# Patient Record
Sex: Female | Born: 1945 | Race: Black or African American | Hispanic: No | Smoking: Never smoker
Health system: Southern US, Community
[De-identification: ages and names within clinical notes are randomized; demographics above are authoritative.]

## PROBLEM LIST (undated history)

## (undated) DIAGNOSIS — C50919 Malignant neoplasm of unspecified site of unspecified female breast: Secondary | ICD-10-CM

## (undated) HISTORY — PX: TUBAL LIGATION: SHX77

## (undated) HISTORY — PX: MASTECTOMY: SHX3

---

## 1998-04-03 ENCOUNTER — Emergency Department (HOSPITAL_COMMUNITY): Admission: EM | Admit: 1998-04-03 | Discharge: 1998-04-03 | Payer: Self-pay | Admitting: Emergency Medicine

## 1998-12-13 ENCOUNTER — Ambulatory Visit (HOSPITAL_COMMUNITY): Admission: RE | Admit: 1998-12-13 | Discharge: 1998-12-13 | Payer: Self-pay | Admitting: Oncology

## 1998-12-13 ENCOUNTER — Encounter: Payer: Self-pay | Admitting: Oncology

## 1999-06-13 ENCOUNTER — Encounter: Payer: Self-pay | Admitting: Sports Medicine

## 1999-06-13 ENCOUNTER — Ambulatory Visit (HOSPITAL_COMMUNITY): Admission: RE | Admit: 1999-06-13 | Discharge: 1999-06-13 | Payer: Self-pay | Admitting: Sports Medicine

## 1999-06-27 ENCOUNTER — Ambulatory Visit (HOSPITAL_COMMUNITY): Admission: RE | Admit: 1999-06-27 | Discharge: 1999-06-27 | Payer: Self-pay | Admitting: Sports Medicine

## 1999-06-27 ENCOUNTER — Encounter: Payer: Self-pay | Admitting: Sports Medicine

## 1999-08-22 ENCOUNTER — Encounter: Payer: Self-pay | Admitting: Sports Medicine

## 1999-08-22 ENCOUNTER — Ambulatory Visit (HOSPITAL_COMMUNITY): Admission: RE | Admit: 1999-08-22 | Discharge: 1999-08-22 | Payer: Self-pay | Admitting: Sports Medicine

## 2000-09-22 ENCOUNTER — Encounter: Admission: RE | Admit: 2000-09-22 | Discharge: 2000-09-22 | Payer: Self-pay | Admitting: Oncology

## 2000-09-22 ENCOUNTER — Encounter: Payer: Self-pay | Admitting: Oncology

## 2000-11-01 ENCOUNTER — Encounter: Payer: Self-pay | Admitting: Neurosurgery

## 2000-11-01 ENCOUNTER — Ambulatory Visit (HOSPITAL_COMMUNITY): Admission: RE | Admit: 2000-11-01 | Discharge: 2000-11-01 | Payer: Self-pay | Admitting: Neurosurgery

## 2000-11-01 ENCOUNTER — Encounter: Payer: Self-pay | Admitting: Sports Medicine

## 2001-07-16 ENCOUNTER — Emergency Department (HOSPITAL_COMMUNITY): Admission: EM | Admit: 2001-07-16 | Discharge: 2001-07-16 | Payer: Self-pay

## 2001-09-01 ENCOUNTER — Other Ambulatory Visit: Admission: RE | Admit: 2001-09-01 | Discharge: 2001-09-01 | Payer: Self-pay | Admitting: Internal Medicine

## 2001-09-27 ENCOUNTER — Encounter: Admission: RE | Admit: 2001-09-27 | Discharge: 2001-09-27 | Payer: Self-pay | Admitting: Internal Medicine

## 2001-09-27 ENCOUNTER — Encounter: Payer: Self-pay | Admitting: Internal Medicine

## 2001-10-27 ENCOUNTER — Emergency Department (HOSPITAL_COMMUNITY): Admission: EM | Admit: 2001-10-27 | Discharge: 2001-10-27 | Payer: Self-pay

## 2002-02-22 ENCOUNTER — Encounter: Payer: Self-pay | Admitting: Sports Medicine

## 2002-02-22 ENCOUNTER — Encounter: Admission: RE | Admit: 2002-02-22 | Discharge: 2002-02-22 | Payer: Self-pay | Admitting: Sports Medicine

## 2002-03-08 ENCOUNTER — Encounter: Payer: Self-pay | Admitting: Sports Medicine

## 2002-03-08 ENCOUNTER — Encounter: Admission: RE | Admit: 2002-03-08 | Discharge: 2002-03-08 | Payer: Self-pay | Admitting: Sports Medicine

## 2002-03-22 ENCOUNTER — Encounter: Admission: RE | Admit: 2002-03-22 | Discharge: 2002-03-22 | Payer: Self-pay | Admitting: Sports Medicine

## 2002-03-22 ENCOUNTER — Encounter: Payer: Self-pay | Admitting: Sports Medicine

## 2002-03-29 ENCOUNTER — Encounter: Admission: RE | Admit: 2002-03-29 | Discharge: 2002-06-19 | Payer: Self-pay | Admitting: Sports Medicine

## 2003-08-31 ENCOUNTER — Encounter: Admission: RE | Admit: 2003-08-31 | Discharge: 2003-08-31 | Payer: Self-pay | Admitting: Internal Medicine

## 2004-11-13 ENCOUNTER — Ambulatory Visit (HOSPITAL_COMMUNITY): Admission: RE | Admit: 2004-11-13 | Discharge: 2004-11-13 | Payer: Self-pay | Admitting: Internal Medicine

## 2005-04-12 ENCOUNTER — Emergency Department (HOSPITAL_COMMUNITY): Admission: EM | Admit: 2005-04-12 | Discharge: 2005-04-12 | Payer: Self-pay | Admitting: Emergency Medicine

## 2005-06-05 ENCOUNTER — Ambulatory Visit (HOSPITAL_COMMUNITY): Admission: RE | Admit: 2005-06-05 | Discharge: 2005-06-05 | Payer: Self-pay | Admitting: Family Medicine

## 2006-08-26 ENCOUNTER — Ambulatory Visit (HOSPITAL_COMMUNITY): Admission: RE | Admit: 2006-08-26 | Discharge: 2006-08-26 | Payer: Self-pay | Admitting: Internal Medicine

## 2006-09-20 ENCOUNTER — Encounter: Admission: RE | Admit: 2006-09-20 | Discharge: 2006-09-20 | Payer: Self-pay | Admitting: Sports Medicine

## 2006-10-08 ENCOUNTER — Encounter: Admission: RE | Admit: 2006-10-08 | Discharge: 2006-10-08 | Payer: Self-pay | Admitting: Sports Medicine

## 2006-10-29 ENCOUNTER — Encounter: Admission: RE | Admit: 2006-10-29 | Discharge: 2006-10-29 | Payer: Self-pay | Admitting: General Surgery

## 2007-01-14 ENCOUNTER — Emergency Department (HOSPITAL_COMMUNITY): Admission: EM | Admit: 2007-01-14 | Discharge: 2007-01-14 | Payer: Self-pay | Admitting: Emergency Medicine

## 2007-08-31 ENCOUNTER — Ambulatory Visit (HOSPITAL_COMMUNITY): Admission: RE | Admit: 2007-08-31 | Discharge: 2007-08-31 | Payer: Self-pay | Admitting: Internal Medicine

## 2008-11-09 ENCOUNTER — Ambulatory Visit (HOSPITAL_COMMUNITY): Admission: RE | Admit: 2008-11-09 | Discharge: 2008-11-09 | Payer: Self-pay | Admitting: Internal Medicine

## 2010-07-16 ENCOUNTER — Ambulatory Visit (HOSPITAL_COMMUNITY): Admission: RE | Admit: 2010-07-16 | Discharge: 2010-07-16 | Payer: Self-pay | Admitting: Internal Medicine

## 2010-10-26 ENCOUNTER — Emergency Department (HOSPITAL_COMMUNITY)
Admission: EM | Admit: 2010-10-26 | Discharge: 2010-10-26 | Payer: Self-pay | Source: Home / Self Care | Admitting: Emergency Medicine

## 2010-10-31 LAB — BASIC METABOLIC PANEL
BUN: 12 mg/dL (ref 6–23)
CO2: 28 mEq/L (ref 19–32)
Calcium: 10.5 mg/dL (ref 8.4–10.5)
Chloride: 104 mEq/L (ref 96–112)
Creatinine, Ser: 0.89 mg/dL (ref 0.4–1.2)
GFR calc Af Amer: >60 mL/min (ref >60)
GFR calc non Af Amer: >60 mL/min (ref >60)
Glucose, Bld: 90 mg/dL (ref 70–99)
Potassium: 3.9 mEq/L (ref 3.5–5.1)
Sodium: 139 mEq/L (ref 135–145)

## 2010-10-31 LAB — CBC
HCT: 37.4 % (ref 36.0–46.0)
Hemoglobin: 12.8 g/dL (ref 12.0–15.0)
MCH: 31.4 pg (ref 26.0–34.0)
MCHC: 34.2 g/dL (ref 30.0–36.0)
MCV: 91.9 fL (ref 78.0–100.0)
Platelets: 292 10*3/uL (ref 150–400)
RBC: 4.07 MIL/uL (ref 3.87–5.11)
RDW: 12.9 % (ref 11.5–15.5)
WBC: 9.6 10*3/uL (ref 4.0–10.5)

## 2010-11-04 ENCOUNTER — Ambulatory Visit (HOSPITAL_COMMUNITY)
Admission: RE | Admit: 2010-11-04 | Discharge: 2010-11-04 | Payer: Self-pay | Source: Home / Self Care | Attending: Orthopaedic Surgery | Admitting: Orthopaedic Surgery

## 2010-11-16 ENCOUNTER — Encounter: Payer: Self-pay | Admitting: Sports Medicine

## 2010-11-20 LAB — SURGICAL PCR SCREEN
MRSA, PCR: NEGATIVE
Staphylococcus aureus: NEGATIVE

## 2011-01-05 LAB — HEPATIC FUNCTION PANEL
ALT: 26 U/L (ref 0–35)
AST: 24 U/L (ref 0–37)
Albumin: 3.5 g/dL (ref 3.5–5.2)
Bilirubin, Direct: 0.2 mg/dL (ref 0.0–0.3)
Total Bilirubin: 0.8 mg/dL (ref 0.3–1.2)

## 2011-01-05 LAB — DIFFERENTIAL
Basophils Relative: 0 % (ref 0–1)
Lymphocytes Relative: 22 % (ref 12–46)
Monocytes Absolute: 0.5 10*3/uL (ref 0.1–1.0)
Monocytes Relative: 5 % (ref 3–12)
Neutro Abs: 6.9 10*3/uL (ref 1.7–7.7)
Neutrophils Relative %: 72 % (ref 43–77)

## 2011-01-05 LAB — BASIC METABOLIC PANEL
Calcium: 10 mg/dL (ref 8.4–10.5)
GFR calc Af Amer: >60 mL/min (ref >60)
GFR calc non Af Amer: >60 mL/min (ref >60)
Potassium: 3.7 mEq/L (ref 3.5–5.1)
Sodium: 139 mEq/L (ref 135–145)

## 2011-01-05 LAB — CBC
HCT: 36.8 % (ref 36.0–46.0)
Hemoglobin: 12.4 g/dL (ref 12.0–15.0)
MCHC: 33.7 g/dL (ref 30.0–36.0)
RBC: 3.99 MIL/uL (ref 3.87–5.11)
WBC: 9.5 10*3/uL (ref 4.0–10.5)

## 2011-01-05 LAB — URINE MICROSCOPIC-ADD ON

## 2011-01-05 LAB — URINALYSIS, ROUTINE W REFLEX MICROSCOPIC
Bilirubin Urine: NEGATIVE
Glucose, UA: NEGATIVE mg/dL
Ketones, ur: NEGATIVE mg/dL
Nitrite: POSITIVE — AB
Specific Gravity, Urine: 1.022 (ref 1.005–1.030)
pH: 5 (ref 5.0–8.0)

## 2011-01-05 LAB — SAMPLE TO BLOOD BANK

## 2011-01-05 LAB — PROTIME-INR: INR: 0.89 (ref 0.00–1.49)

## 2011-07-30 ENCOUNTER — Other Ambulatory Visit (HOSPITAL_COMMUNITY): Payer: Self-pay | Admitting: Internal Medicine

## 2011-07-30 DIAGNOSIS — Z1231 Encounter for screening mammogram for malignant neoplasm of breast: Secondary | ICD-10-CM

## 2011-08-12 ENCOUNTER — Ambulatory Visit (HOSPITAL_COMMUNITY)
Admission: RE | Admit: 2011-08-12 | Discharge: 2011-08-12 | Disposition: A | Discharge status: Home / Self Care | Payer: Medicare Other | Source: Ambulatory Visit | Attending: Internal Medicine | Admitting: Internal Medicine

## 2011-08-12 DIAGNOSIS — Z1231 Encounter for screening mammogram for malignant neoplasm of breast: Secondary | ICD-10-CM | POA: Insufficient documentation

## 2012-07-25 ENCOUNTER — Other Ambulatory Visit (HOSPITAL_COMMUNITY): Payer: Self-pay | Admitting: Internal Medicine

## 2012-07-28 ENCOUNTER — Other Ambulatory Visit: Payer: Self-pay | Admitting: Internal Medicine

## 2012-07-28 DIAGNOSIS — N63 Unspecified lump in unspecified breast: Secondary | ICD-10-CM

## 2012-08-02 ENCOUNTER — Ambulatory Visit
Admission: RE | Admit: 2012-08-02 | Discharge: 2012-08-02 | Disposition: A | Discharge status: Home / Self Care | Payer: Medicare Other | Source: Ambulatory Visit | Attending: Internal Medicine | Admitting: Internal Medicine

## 2012-08-02 DIAGNOSIS — N63 Unspecified lump in unspecified breast: Secondary | ICD-10-CM

## 2013-07-20 ENCOUNTER — Other Ambulatory Visit (HOSPITAL_COMMUNITY): Payer: Self-pay | Admitting: Internal Medicine

## 2013-07-20 DIAGNOSIS — Z1231 Encounter for screening mammogram for malignant neoplasm of breast: Secondary | ICD-10-CM

## 2013-08-03 ENCOUNTER — Ambulatory Visit (HOSPITAL_COMMUNITY)
Admission: RE | Admit: 2013-08-03 | Discharge: 2013-08-03 | Disposition: A | Discharge status: Home / Self Care | Payer: Medicare Other | Source: Ambulatory Visit | Attending: Internal Medicine | Admitting: Internal Medicine

## 2013-08-03 DIAGNOSIS — Z1231 Encounter for screening mammogram for malignant neoplasm of breast: Secondary | ICD-10-CM

## 2015-08-22 ENCOUNTER — Encounter (HOSPITAL_COMMUNITY): Payer: Self-pay | Admitting: Emergency Medicine

## 2015-08-22 ENCOUNTER — Emergency Department (HOSPITAL_COMMUNITY)
Admission: EM | Admit: 2015-08-22 | Discharge: 2015-08-22 | Disposition: A | Discharge status: Home / Self Care | Payer: Medicare Other | Attending: Emergency Medicine | Admitting: Emergency Medicine

## 2015-08-22 ENCOUNTER — Emergency Department (HOSPITAL_COMMUNITY): Payer: Medicare Other

## 2015-08-22 DIAGNOSIS — R51 Headache: Secondary | ICD-10-CM

## 2015-08-22 DIAGNOSIS — R519 Headache, unspecified: Secondary | ICD-10-CM

## 2015-08-22 DIAGNOSIS — J019 Acute sinusitis, unspecified: Secondary | ICD-10-CM | POA: Insufficient documentation

## 2015-08-22 DIAGNOSIS — Z853 Personal history of malignant neoplasm of breast: Secondary | ICD-10-CM | POA: Diagnosis not present

## 2015-08-22 DIAGNOSIS — R0981 Nasal congestion: Secondary | ICD-10-CM

## 2015-08-22 HISTORY — DX: Malignant neoplasm of unspecified site of unspecified female breast: C50.919

## 2015-08-22 MED ORDER — KETOROLAC TROMETHAMINE 60 MG/2ML IM SOLN: 15.0000 mg | Freq: Once | INTRAMUSCULAR | Status: DC

## 2015-08-22 MED ORDER — KETOROLAC TROMETHAMINE 30 MG/ML IJ SOLN
15.0000 mg | Freq: Once | INTRAMUSCULAR | Status: AC
  Administered 2015-08-22: 15 mg via INTRAVENOUS
  Filled 2015-08-22: qty 1

## 2015-08-22 MED ORDER — LORATADINE 10 MG PO TABS: 10.0000 mg | ORAL_TABLET | Freq: Every day | ORAL | Status: AC

## 2015-08-22 MED ORDER — NAPROXEN 500 MG PO TABS: 500.0000 mg | ORAL_TABLET | Freq: Two times a day (BID) | ORAL | Status: AC | PRN

## 2015-08-22 MED ORDER — SODIUM CHLORIDE 0.9 % IV BOLUS (SEPSIS)
1000.0000 mL | Freq: Once | INTRAVENOUS | Status: AC
  Administered 2015-08-22: 1000 mL via INTRAVENOUS

## 2015-08-22 MED ORDER — FLUTICASONE PROPIONATE 50 MCG/ACT NA SUSP: 2.0000 | Freq: Every day | NASAL | Status: AC

## 2015-08-22 NOTE — Discharge Instructions (Signed)
Continue to stay well-hydrated. Continue to alternate between Tylenol and naprosyn for pain or fever. Use netipot and flonase to help with nasal congestion. May consider over-the-counter Benadryl or other antihistamine (such as claritin, etc) to decrease secretions. Followup with your primary care doctor in 5-7 days for recheck of ongoing symptoms. Return to emergency department for emergent changing or worsening of symptoms.    Sinus Headache A sinus headache occurs when the paranasal sinuses become clogged or swollen. Paranasal sinuses are air pockets within the bones of the face. Sinus headaches can range from mild to severe. CAUSES A sinus headache can result from various conditions that affect the sinuses, such as:  Colds.  Sinus infections.  Allergies. SYMPTOMS The main symptom of this condition is a headache that may feel like pain or pressure in the face, forehead, ears, or upper teeth. People who have a sinus headache often have other symptoms, such as:  Congested or runny nose.  Fever.  Inability to smell. Weather changes can make symptoms worse. DIAGNOSIS This condition may be diagnosed based on:  A physical exam and medical history.  Imaging tests, such as a CT scan and MRI, to check for problems with the sinuses.  A specialist may look into the sinuses with a tool that has a camera (endoscopy). TREATMENT Treatment for this condition depends on the cause.  Sinus pain that is caused by a sinus infection may be treated with antibiotic medicine.  Sinus pain that is caused by allergies may be helped by allergy medicines (antihistamines) and medicated nasal sprays.  Sinus pain that is caused by congestion may be helped by flushing the nose and sinuses with saline solution. HOME CARE INSTRUCTIONS  Take medicines only as directed by your health care provider.  If you were prescribed an antibiotic medicine, finish all of it even if you start to feel better.  If you  have congestion, use a nasal spray to help reduce pressure.  If directed, apply a warm, moist washcloth to your face to help relieve pain. SEEK MEDICAL CARE IF:  You have headaches more than one time each week.  You have sensitivity to light or sound.  You have a fever.  You feel sick to your stomach (nauseous) or you throw up (vomit).  Your headaches do not get better with treatment. Many people think that they have a sinus headache when they actually have migraines or tension headaches. SEEK IMMEDIATE MEDICAL CARE IF:  You have vision problems.  You have sudden, severe pain in your face or head.  You have a seizure.  You are confused.  You have a stiff neck.   This information is not intended to replace advice given to you by your health care provider. Make sure you discuss any questions you have with your health care provider.   Document Released: 11/19/2004 Document Revised: 02/26/2015 Document Reviewed: 10/08/2014 Elsevier Interactive Patient Education 2016 [employer]  Sinusitis, Adult Sinusitis is redness, soreness, and inflammation of the paranasal sinuses. Paranasal sinuses are air pockets within the bones of your face. They are located beneath your eyes, in the middle of your forehead, and above your eyes. In healthy paranasal sinuses, mucus is able to drain out, and air is able to circulate through them by way of your nose. However, when your paranasal sinuses are inflamed, mucus and air can become trapped. This can allow bacteria and other germs to grow and cause infection. Sinusitis can develop quickly and last only a short time (acute) or  continue over a long period (chronic). Sinusitis that lasts for more than 12 weeks is considered chronic. CAUSES Causes of sinusitis include:  Allergies.  Structural abnormalities, such as displacement of the cartilage that separates your nostrils (deviated septum), which can decrease the air flow through your nose and sinuses  and affect sinus drainage.  Functional abnormalities, such as when the small hairs (cilia) that line your sinuses and help remove mucus do not work properly or are not present. SIGNS AND SYMPTOMS Symptoms of acute and chronic sinusitis are the same. The primary symptoms are pain and pressure around the affected sinuses. Other symptoms include:  Upper toothache.  Earache.  Headache.  Bad breath.  Decreased sense of smell and taste.  A cough, which worsens when you are lying flat.  Fatigue.  Fever.  Thick drainage from your nose, which often is green and may contain pus (purulent).  Swelling and warmth over the affected sinuses. DIAGNOSIS Your health care provider will perform a physical exam. During your exam, your health care provider may perform any of the following to help determine if you have acute sinusitis or chronic sinusitis:  Look in your nose for signs of abnormal growths in your nostrils (nasal polyps).  Tap over the affected sinus to check for signs of infection.  View the inside of your sinuses using an imaging device that has a light attached (endoscope). If your health care provider suspects that you have chronic sinusitis, one or more of the following tests may be recommended:  Allergy tests.  Nasal culture. A sample of mucus is taken from your nose, sent to a lab, and screened for bacteria.  Nasal cytology. A sample of mucus is taken from your nose and examined by your health care provider to determine if your sinusitis is related to an allergy. TREATMENT Most cases of acute sinusitis are related to a viral infection and will resolve on their own within 10 days. Sometimes, medicines are prescribed to help relieve symptoms of both acute and chronic sinusitis. These may include pain medicines, decongestants, nasal steroid sprays, or saline sprays. However, for sinusitis related to a bacterial infection, your health care provider will prescribe antibiotic  medicines. These are medicines that will help kill the bacteria causing the infection. Rarely, sinusitis is caused by a fungal infection. In these cases, your health care provider will prescribe antifungal medicine. For some cases of chronic sinusitis, surgery is needed. Generally, these are cases in which sinusitis recurs more than 3 times per year, despite other treatments. HOME CARE INSTRUCTIONS  Drink plenty of water. Water helps thin the mucus so your sinuses can drain more easily.  Use a humidifier.  Inhale steam 3-4 times a day (for example, sit in the bathroom with the shower running).  Apply a warm, moist washcloth to your face 3-4 times a day, or as directed by your health care provider.  Use saline nasal sprays to help moisten and clean your sinuses.  Take medicines only as directed by your health care provider.  If you were prescribed either an antibiotic or antifungal medicine, finish it all even if you start to feel better. SEEK IMMEDIATE MEDICAL CARE IF:  You have increasing pain or severe headaches.  You have nausea, vomiting, or drowsiness.  You have swelling around your face.  You have vision problems.  You have a stiff neck.  You have difficulty breathing.   This information is not intended to replace advice given to you by your health care provider.  Make sure you discuss any questions you have with your health care provider.   Document Released: 10/12/2005 Document Revised: 11/02/2014 Document Reviewed: 10/27/2011 Elsevier Interactive Patient Education 2016 Elsevier Inc.  Sinus Rinse WHAT IS A SINUS RINSE? A sinus rinse is a home treatment. It rinses your sinuses with a mixture of salt and water (saline solution). Sinuses are air-filled spaces in your skull behind the bones of your face and forehead. They open into your nasal cavity. To do a sinus rinse, you will need:  Saline solution.  Neti pot or spray bottle. This releases the saline solution into  your nose and through your sinuses. You can buy neti pots and spray bottles at:  Your local pharmacy.  A health food store.  Online. WHEN WOULD I DO A SINUS RINSE?  A sinus rinse can help to clear your nasal cavity. It can clear:   Mucus.  Dirt.  Dust.  Pollen. You may do a sinus rinse when you have:  A cold.  A virus.  Allergies.  A sinus infection.  A stuffy nose. If you are considering a sinus rinse:  Ask your child's doctor before doing a sinus rinse on your child.  Do not do a sinus rinse if you have had:  Ear or nasal surgery.  An ear infection.  Blocked ears. HOW DO I DO A SINUS RINSE?   Wash your hands.  Disinfect your device using the directions that came with the device.  Dry your device.  Use the solution that comes with your device or one that is sold separately in stores. Follow the mixing directions on the package.  Fill your device with the amount of saline solution as stated in the device instructions.  Stand over a sink and tilt your head sideways over the sink.  Place the spout of the device in your upper nostril (the one closer to the ceiling).  Gently pour or squeeze the saline solution into the nasal cavity. The liquid should drain to the lower nostril if you are not too congested.  Gently blow your nose. Blowing too hard may cause ear pain.  Repeat in the other nostril.  Clean and rinse your device with clean water.  Air-dry your device. ARE THERE RISKS OF A SINUS RINSE?  Sinus rinse is normally very safe and helpful. However, there are a few risks, which include:   A burning feeling in the sinuses. This may happen if you do not make the saline solution as instructed. Make sure to follow all directions when making the saline solution.  Infection from unclean water. This is rare, but possible.  Nasal irritation.   This information is not intended to replace advice given to you by your health care provider. Make sure you  discuss any questions you have with your health care provider.   Document Released: 05/09/2014 Document Reviewed: 05/09/2014 Elsevier Interactive Patient Education [employer].

## 2015-08-22 NOTE — ED Notes (Signed)
Pt states she has had a headache for the past 3 days  Pt states the pain is on the left side of her head   Pt denies any N/V or visual disturbances  Pt states she took 2 tylenol this morning at 6am without relief

## 2015-08-22 NOTE — ED Provider Notes (Signed)
CSN: [id number]     Arrival date & time 08/22/15  3662 History   First MD Initiated Contact with Patient 08/22/15 [phone]     Chief Complaint  Patient presents with  . Headache     (Consider location/radiation/quality/duration/timing/severity/associated sxs/prior Treatment) HPI Comments: Joan Barber is a 69 y.o. female with a PMHx of breast cancer s/p mastectomy in the 1980s, who presents to the ED with complaints of gradual onset left-sided headache 3 days. She describes the pain is 7/10 constant throbbing nonradiating worse with activity or movement and unrelieved with 2 Advil that she took approximately an hour and half prior to arrival. She reports the headache is similar to her prior headaches, with no thunderclap onset. Associated symptoms include mild sinus congestion. She denies any fevers, chills, lightheadedness, vision changes, sore throat, eye pain or drainage, rhinorrhea, tinnitus, hearing loss, neck pain or stiffness, chest pain, shortness breath, cough, abdominal pain, nausea, vomiting or diarrhea, constipation, dysuria, hematuria, numbness, tingling, weakness, tremors, speech difficulty, facial asymmetry, or recent tick bites.  Patient is a 69 y.o. female presenting with headaches. The history is provided by the patient. No language interpreter was used.  Headache Pain location:  L parietal Quality: throbbing. Radiates to:  Does not radiate Severity currently:  7/10 Severity at highest:  7/10 Onset quality:  Gradual Duration:  3 days Timing:  Constant Progression:  Unchanged Chronicity:  New Similar to prior headaches: yes   Relieved by:  Nothing Worsened by:  Activity Ineffective treatments:  NSAIDs Associated symptoms: sinus pressure   Associated symptoms: no abdominal pain, no blurred vision, no cough, no diarrhea, no dizziness, no ear pain, no eye pain, no fever, no focal weakness, no hearing loss, no loss of balance, no myalgias, no nausea, no neck pain, no neck  stiffness, no numbness, no paresthesias, no sore throat, no syncope, no tingling, no visual change, no vomiting and no weakness     Past Medical History  Diagnosis Date  . Breast cancer [facility])    Past Surgical History  Procedure Laterality Date  . Mastectomy    . Tubal ligation     Family History  Problem Relation Age of Onset  . Hypertension Other   . Stroke Other   . Cancer Other    Social History  Substance Use Topics  . Smoking status: Never Smoker   . Smokeless tobacco: None  . Alcohol Use: Yes     Comment: rare   OB History    No data available     Review of Systems  Constitutional: Negative for fever and chills.  HENT: Positive for sinus pressure. Negative for ear discharge, ear pain, hearing loss, rhinorrhea, sore throat and tinnitus.   Eyes: Negative for blurred vision, pain, discharge and visual disturbance.  Respiratory: Negative for cough and shortness of breath.   Cardiovascular: Negative for chest pain and syncope.  Gastrointestinal: Negative for nausea, vomiting, abdominal pain, diarrhea and constipation.  Genitourinary: Negative for dysuria and hematuria.  Musculoskeletal: Negative for myalgias, arthralgias, neck pain and neck stiffness.  Skin: Negative for rash.  Allergic/Immunologic: Negative for immunocompromised state.  Neurological: Positive for headaches. Negative for dizziness, tremors, focal weakness, syncope, speech difficulty, weakness, light-headedness, numbness, paresthesias and loss of balance.  Psychiatric/Behavioral: Negative for confusion.    10 Systems reviewed and are negative for acute change except as noted in the HPI.    Allergies  Review of patient's allergies indicates no known allergies.  Home Medications   Prior to Admission medications  Not on File   BP 126/99 mmHg  Pulse 87  Temp(Src) 97.9 F (36.6 C) (Oral)  Resp 16  SpO2 100% Physical Exam  Constitutional: She is oriented to person, place, and time. Vital signs  are normal. She appears well-developed and well-nourished.  Non-toxic appearance. No distress.  Afebrile, nontoxic, NAD  HENT:  Head: Normocephalic and atraumatic.  Nose: Mucosal edema present. Right sinus exhibits frontal sinus tenderness. Right sinus exhibits no maxillary sinus tenderness. Left sinus exhibits frontal sinus tenderness. Left sinus exhibits no maxillary sinus tenderness.  Mouth/Throat: Oropharynx is clear and moist and mucous membranes are normal.  Nose with mucosal edema L>R without rhinorrhea, with diffuse frontal sinus TTP.   Eyes: Conjunctivae and EOM are normal. Pupils are equal, round, and reactive to light. Right eye exhibits no discharge. Left eye exhibits no discharge.  PERRL, EOMI, no nystagmus, no visual field deficits   Neck: Normal range of motion. Neck supple.  No meningismus  Cardiovascular: Normal rate, regular rhythm, normal heart sounds and intact distal pulses.  Exam reveals no gallop and no friction rub.   No murmur heard. Pulmonary/Chest: Effort normal and breath sounds normal. No respiratory distress. She has no decreased breath sounds. She has no wheezes. She has no rhonchi. She has no rales.  Abdominal: Soft. Normal appearance and bowel sounds are normal. She exhibits no distension. There is no tenderness. There is no rigidity, no rebound and no guarding.  Musculoskeletal: Normal range of motion.  MAE x4 Strength and sensation grossly intact Distal pulses intact Gait steady  Neurological: She is alert and oriented to person, place, and time. She has normal strength. No cranial nerve deficit or sensory deficit. Coordination and gait normal. GCS eye subscore is 4. GCS verbal subscore is 5. GCS motor subscore is 6.  CN 2-12 grossly intact A&O x4 GCS 15 Sensation and strength intact Gait nonataxic including with tandem walking Coordination with finger-to-nose WNL Neg pronator drift   Skin: Skin is warm, dry and intact. No rash noted.  Psychiatric: She  has a normal mood and affect.  Nursing note and vitals reviewed.   ED Course  Procedures (including critical care time) Labs Review Labs Reviewed - No data to display  Imaging Review Ct Head Wo Contrast  08/22/2015  CLINICAL DATA:  Three-day history of left-sided headache. History of breast carcinoma EXAM: CT HEAD WITHOUT CONTRAST TECHNIQUE: Contiguous axial images were obtained from the base of the skull through the vertex without intravenous contrast. COMPARISON:  None. FINDINGS: The ventricles are normal in size and configuration. There is no intracranial mass, hemorrhage, extra-axial fluid collection, or midline shift. Gray-white compartments are normal. No acute infarct is evident. Bony calvarium appears intact. The visualized mastoid air cells are clear. IMPRESSION: Study within normal limits. Electronically Signed   By: Lowella Grip III M.D.   On: 08/22/2015 08:39   I have personally reviewed and evaluated these images and lab results as part of my medical decision-making.   EKG Interpretation None      MDM   Final diagnoses:  Acute nonintractable headache, unspecified headache type  Nasal sinus congestion  Acute sinusitis, recurrence not specified, unspecified location    69 y.o. female here with gradual onset HA x3 days, similar to prior headaches, with some mild sinus congestion and frontal sinus tenderness. No focal neuro deficits on exam. Likely developing sinusitis, but given age, discussed case with my attending Dr. Audie Pinto who would like to proceed with head CT to evaluate for intracranial  pathology. Toradol and fluids for pain now. Pt driving home therefore cannot give narcotics or any sedative medications. Will reassess shortly.   9:04 AM CT WNL. Pt feeling improved. Will start on flonase and claritin, give naprosyn for pain, discussed tylenol use as well. Doubt need for abx given lack of severe symptoms. F/up with PCP in 1wk. I explained the diagnosis and have  given explicit precautions to return to the ER including for any other new or worsening symptoms. The patient understands and accepts the medical plan as it's been dictated and I have answered their questions. Discharge instructions concerning home care and prescriptions have been given. The patient is STABLE and is discharged to home in good condition.  BP 126/99 mmHg  Pulse 87  Temp(Src) 97.9 F (36.6 C) (Oral)  Resp 16  SpO2 100%  Meds ordered this encounter  Medications  . sodium chloride 0.9 % bolus 1,000 mL    Sig:   . ketorolac (TORADOL) 30 MG/ML injection 15 mg    Sig:   . naproxen (NAPROSYN) 500 MG tablet    Sig: Take 1 tablet (500 mg total) by mouth 2 (two) times daily as needed for mild pain, moderate pain or headache (TAKE WITH MEALS.).    Dispense:  20 tablet    Refill:  0    Order Specific Question:  Supervising Provider    Answer:  MILLER, BRIAN [[id number]]  . fluticasone (FLONASE) 50 MCG/ACT nasal spray    Sig: Place 2 sprays into both nostrils daily.    Dispense:  16 g    Refill:  0    Order Specific Question:  Supervising Provider    Answer:  MILLER, BRIAN [[id number]]  . loratadine (CLARITIN) 10 MG tablet    Sig: Take 1 tablet (10 mg total) by mouth daily.    Dispense:  30 tablet    Refill:  0    Order Specific Question:  Supervising Provider    Answer:  Noemi Chapel [[id number]]     Messi Twedt Camprubi-Soms, PA-C 08/22/15 [id number]  Leonard Schwartz, MD 08/23/15 [phone]

## 2016-06-02 ENCOUNTER — Other Ambulatory Visit: Payer: Self-pay | Admitting: Nurse Practitioner

## 2016-06-02 DIAGNOSIS — N6325 Unspecified lump in the left breast, overlapping quadrants: Secondary | ICD-10-CM

## 2016-06-02 DIAGNOSIS — N6320 Unspecified lump in the left breast, unspecified quadrant: Principal | ICD-10-CM

## 2016-06-08 ENCOUNTER — Ambulatory Visit
Admission: RE | Admit: 2016-06-08 | Discharge: 2016-06-08 | Disposition: A | Discharge status: Home / Self Care | Payer: Medicare Other | Source: Ambulatory Visit | Attending: Internal Medicine | Admitting: Internal Medicine

## 2016-06-08 ENCOUNTER — Other Ambulatory Visit: Payer: Self-pay | Admitting: Nurse Practitioner

## 2016-06-08 DIAGNOSIS — N6320 Unspecified lump in the left breast, unspecified quadrant: Principal | ICD-10-CM

## 2016-06-08 DIAGNOSIS — N6325 Unspecified lump in the left breast, overlapping quadrants: Secondary | ICD-10-CM

## 2017-04-16 IMAGING — CT CT HEAD W/O CM
2 series · 16 of 30 positions shown, 20 images · non-contrast
Comparison: None.

CLINICAL DATA: Three-day history of left-sided headache. History of
breast carcinoma

EXAM:
CT HEAD WITHOUT CONTRAST
TECHNIQUE: Contiguous axial images were obtained from the base of the skull
through the vertex without intravenous contrast.

[Series 2: head w/o · axial · non-contrast · 0.45mm/px · z∈[-172,-52]mm · 13 of 28 slices shown, 17 images]
[im 2/28  brain]
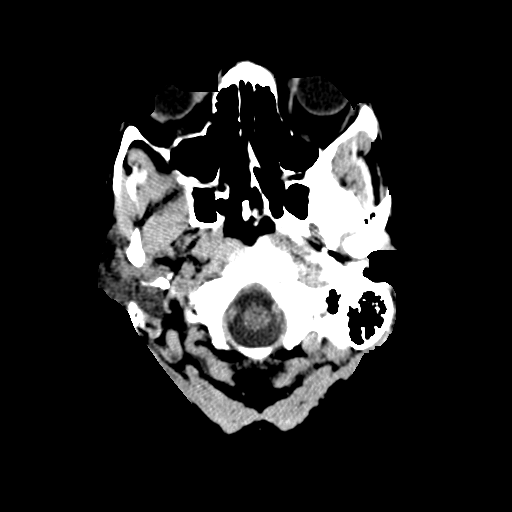
[im 2/28  bone]
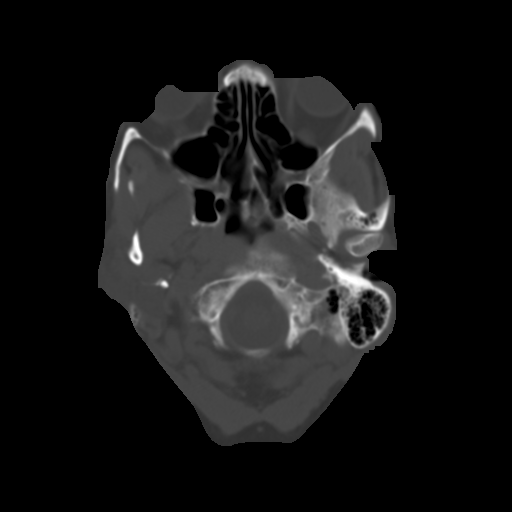
[im 4/28  brain]
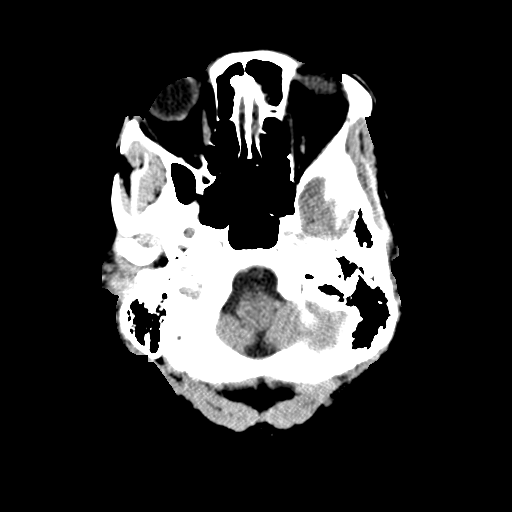
[im 6/28  brain]
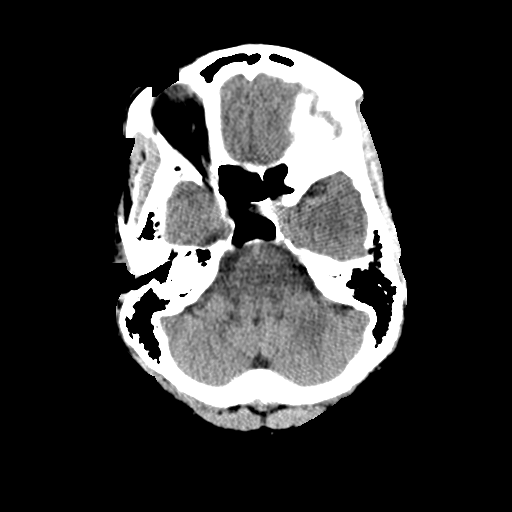
[im 8/28  brain]
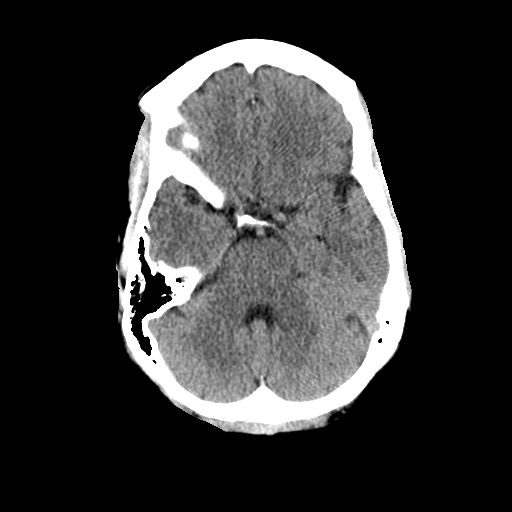
[im 10/28  brain]
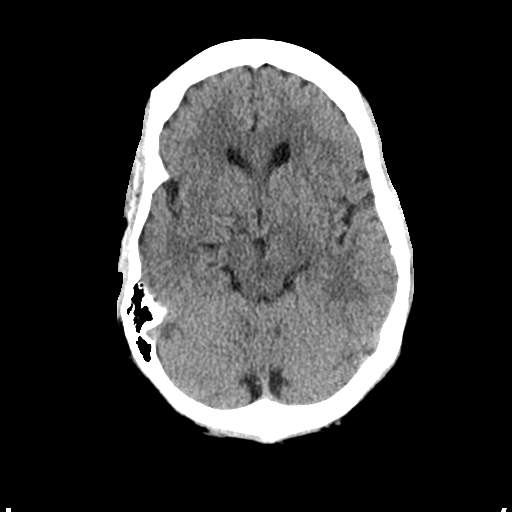
[im 10/28  bone]
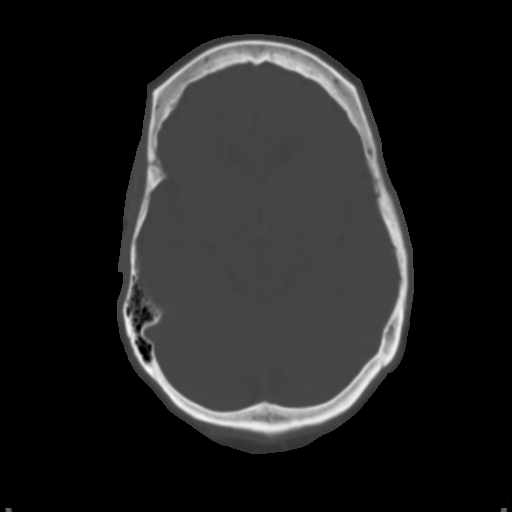
[im 12/28  brain]
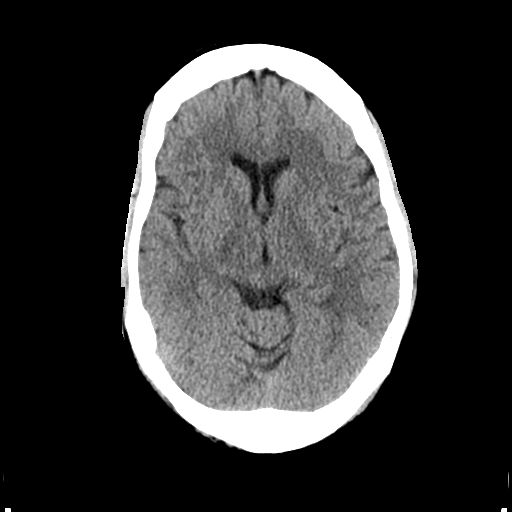
[im 14/28  brain]
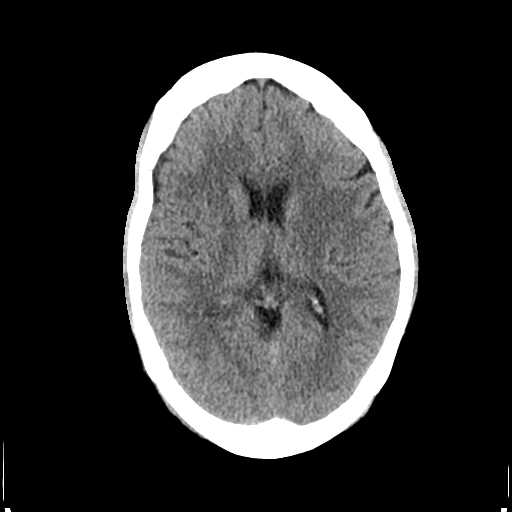
[im 16/28  brain]
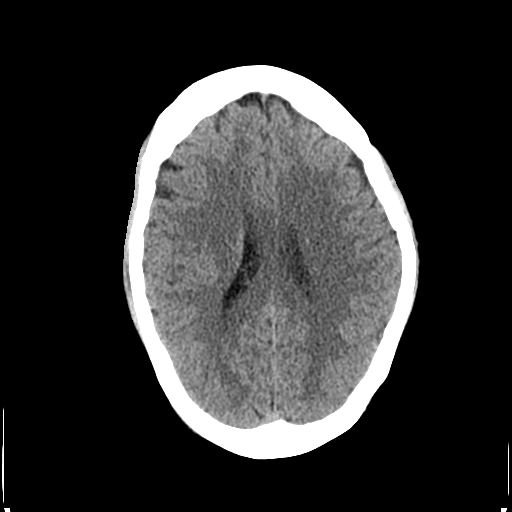
[im 18/28  brain]
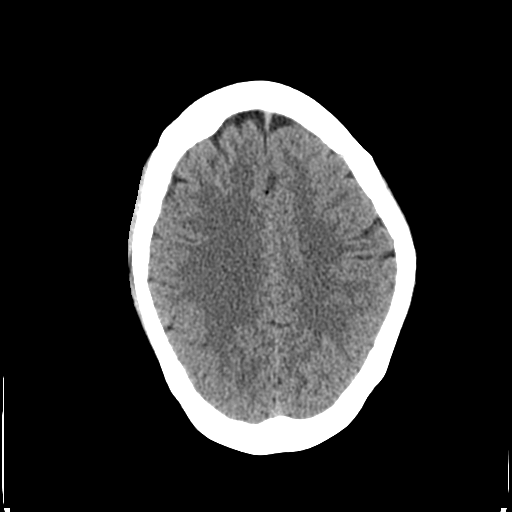
[im 18/28  bone]
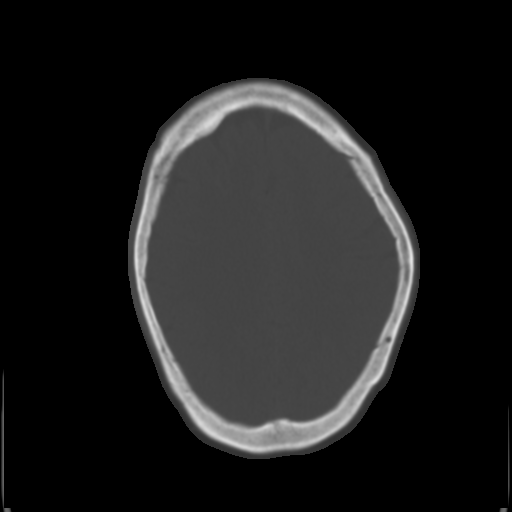
[im 20/28  brain]
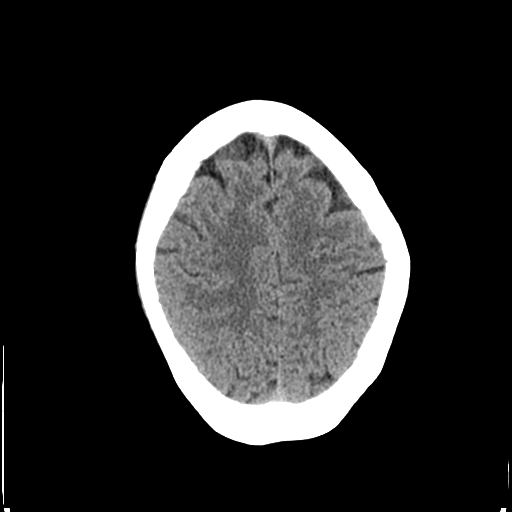
[im 22/28  brain]
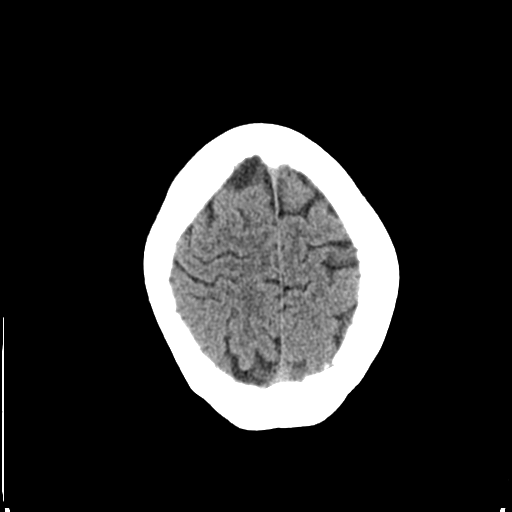
[im 24/28  brain]
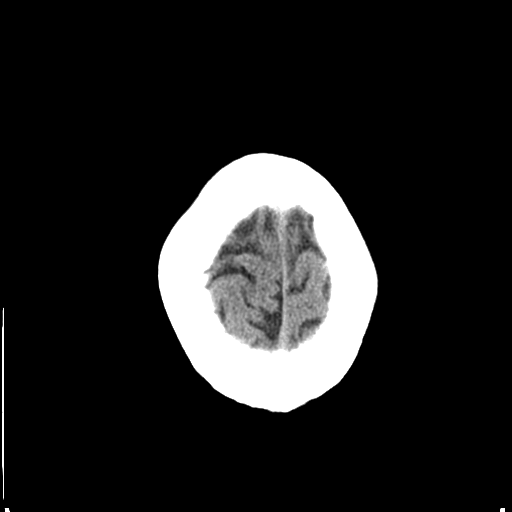
[im 26/28  brain]
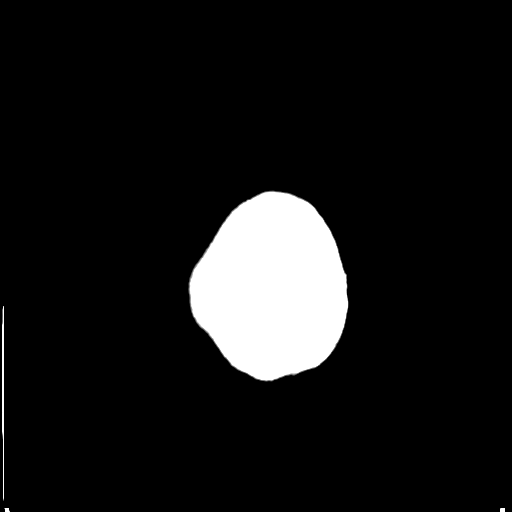
[im 26/28  bone]
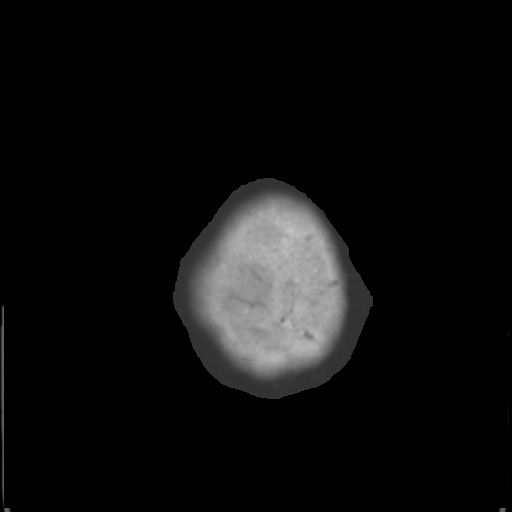

[Series 3: bone windows · axial · 0.45mm/px · z∈[-172,-132]mm · 3 of 28 slices shown]
[im 2/28  bone]
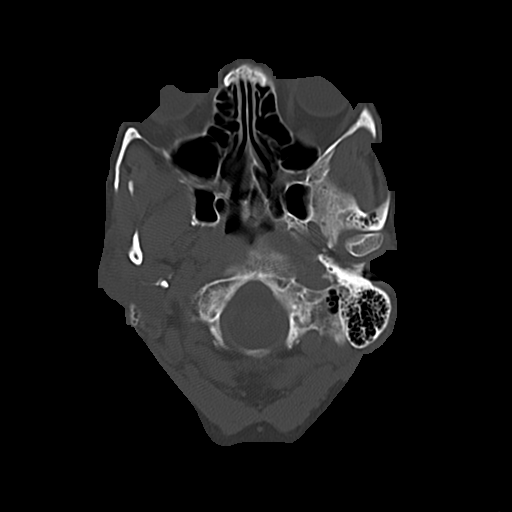
[im 6/28  bone]
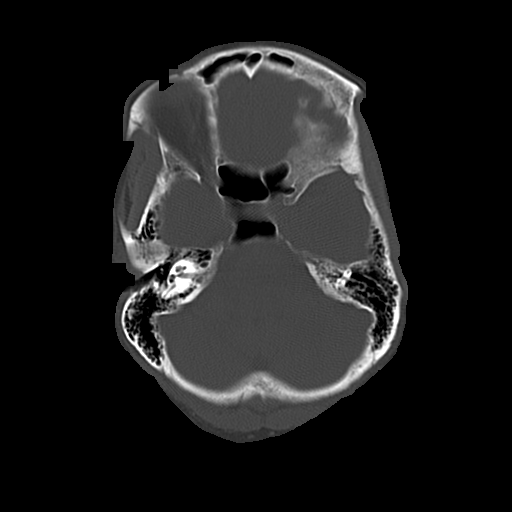
[im 10/28  bone]
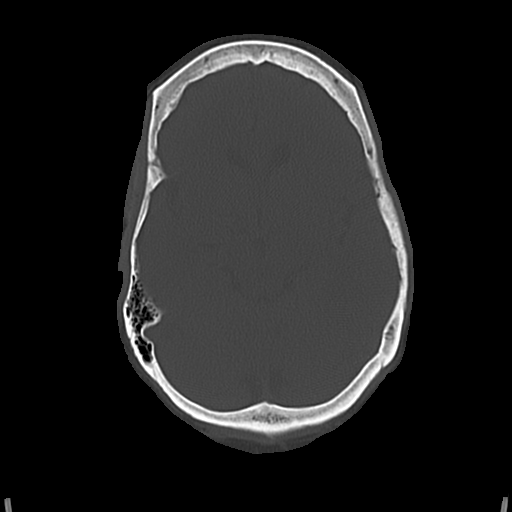

[16 of 30 positions shown; findings below may reference images not displayed]

FINDINGS: The ventricles are normal in size and configuration. There is no
intracranial mass, hemorrhage, extra-axial fluid collection, or
midline shift. Gray-white compartments are normal. No acute infarct
is evident. Bony calvarium appears intact. The visualized mastoid
air cells are clear.
IMPRESSION: Study within normal limits.

## 2017-07-26 DEATH — deceased
